# Patient Record
Sex: Male | Born: 1965 | Race: White | Hispanic: No | Marital: Married | Smoking: Former smoker
Health system: Southern US, Community
[De-identification: ages and names within clinical notes are randomized; demographics above are authoritative.]

## PROBLEM LIST (undated history)

## (undated) DIAGNOSIS — K219 Gastro-esophageal reflux disease without esophagitis: Secondary | ICD-10-CM

## (undated) DIAGNOSIS — F419 Anxiety disorder, unspecified: Secondary | ICD-10-CM

## (undated) DIAGNOSIS — I10 Essential (primary) hypertension: Secondary | ICD-10-CM

## (undated) DIAGNOSIS — E785 Hyperlipidemia, unspecified: Secondary | ICD-10-CM

## (undated) DIAGNOSIS — N419 Inflammatory disease of prostate, unspecified: Secondary | ICD-10-CM

## (undated) HISTORY — PX: NASAL SINUS SURGERY: SHX719

---

## 2012-12-03 ENCOUNTER — Emergency Department (HOSPITAL_BASED_OUTPATIENT_CLINIC_OR_DEPARTMENT_OTHER)
Admission: EM | Admit: 2012-12-03 | Discharge: 2012-12-03 | Disposition: A | Discharge status: Home / Self Care | Payer: BC Managed Care – PPO | Attending: Emergency Medicine | Admitting: Emergency Medicine

## 2012-12-03 ENCOUNTER — Emergency Department (HOSPITAL_BASED_OUTPATIENT_CLINIC_OR_DEPARTMENT_OTHER): Payer: BC Managed Care – PPO

## 2012-12-03 ENCOUNTER — Encounter (HOSPITAL_BASED_OUTPATIENT_CLINIC_OR_DEPARTMENT_OTHER): Payer: Self-pay | Admitting: Emergency Medicine

## 2012-12-03 DIAGNOSIS — Z87448 Personal history of other diseases of urinary system: Secondary | ICD-10-CM | POA: Insufficient documentation

## 2012-12-03 DIAGNOSIS — R509 Fever, unspecified: Secondary | ICD-10-CM | POA: Insufficient documentation

## 2012-12-03 DIAGNOSIS — N451 Epididymitis: Secondary | ICD-10-CM

## 2012-12-03 DIAGNOSIS — I10 Essential (primary) hypertension: Secondary | ICD-10-CM | POA: Insufficient documentation

## 2012-12-03 DIAGNOSIS — F411 Generalized anxiety disorder: Secondary | ICD-10-CM | POA: Insufficient documentation

## 2012-12-03 DIAGNOSIS — E785 Hyperlipidemia, unspecified: Secondary | ICD-10-CM | POA: Insufficient documentation

## 2012-12-03 DIAGNOSIS — N453 Epididymo-orchitis: Secondary | ICD-10-CM | POA: Insufficient documentation

## 2012-12-03 DIAGNOSIS — Z880 Allergy status to penicillin: Secondary | ICD-10-CM | POA: Insufficient documentation

## 2012-12-03 DIAGNOSIS — R110 Nausea: Secondary | ICD-10-CM | POA: Insufficient documentation

## 2012-12-03 DIAGNOSIS — Z87891 Personal history of nicotine dependence: Secondary | ICD-10-CM | POA: Insufficient documentation

## 2012-12-03 DIAGNOSIS — Z79899 Other long term (current) drug therapy: Secondary | ICD-10-CM | POA: Insufficient documentation

## 2012-12-03 HISTORY — DX: Essential (primary) hypertension: I10

## 2012-12-03 HISTORY — DX: Hyperlipidemia, unspecified: E78.5

## 2012-12-03 HISTORY — DX: Inflammatory disease of prostate, unspecified: N41.9

## 2012-12-03 HISTORY — DX: Anxiety disorder, unspecified: F41.9

## 2012-12-03 LAB — URINALYSIS, ROUTINE W REFLEX MICROSCOPIC
Glucose, UA: NEGATIVE mg/dL
Ketones, ur: NEGATIVE mg/dL
Nitrite: NEGATIVE
Protein, ur: NEGATIVE mg/dL
pH: 6 (ref 5.0–8.0)

## 2012-12-03 LAB — URINE MICROSCOPIC-ADD ON

## 2012-12-03 MED ORDER — LEVOFLOXACIN 500 MG PO TABS
500.0000 mg | ORAL_TABLET | Freq: Once | ORAL | Status: AC
  Administered 2012-12-03: 500 mg via ORAL
  Filled 2012-12-03: qty 1

## 2012-12-03 MED ORDER — HYDROMORPHONE HCL PF 1 MG/ML IJ SOLN
1.0000 mg | Freq: Once | INTRAMUSCULAR | Status: AC
  Administered 2012-12-03: 1 mg via INTRAMUSCULAR
  Filled 2012-12-03: qty 1

## 2012-12-03 MED ORDER — LEVOFLOXACIN 500 MG PO TABS: 500.0000 mg | ORAL_TABLET | Freq: Every day | ORAL | Status: AC

## 2012-12-03 MED ORDER — HYDROCODONE-ACETAMINOPHEN 5-325 MG PO TABS: 1.0000 | ORAL_TABLET | Freq: Three times a day (TID) | ORAL | Status: AC | PRN

## 2012-12-03 NOTE — ED Provider Notes (Signed)
CSN: [id number]     Arrival date & time 12/03/12  1159 History   First MD Initiated Contact with Patient 12/03/12 1214     Chief Complaint  Patient presents with  . Testicle Pain   HPI  Patient presents with scrotal pain, right inguinal crease pain, dysuria.  This episode began in the past 2 days and became worse over the past day.  The pain is focally about the distal right inguinal canal, right scrotum.  He notes dysuria, without polyuria, but with increased need for pressure to urinate. Presents with nausea, diaphoresis, but no fever. Patient is one prior similar episode in the past, diagnosed as prostatitis. He is generally well, take medication only for hypertension. He saw a family practice physician yesterday, was started on Flomax.  Urine culture pending   Past Medical History  Diagnosis Date  . Hypertension   . Anxiety   . Hyperlipidemia   . Prostatitis    Past Surgical History  Procedure Laterality Date  . Nasal sinus surgery     No family history on file. History  Substance Use Topics  . Smoking status: Former Smoker    Quit date: 09/14/2011  . Smokeless tobacco: Not on file  . Alcohol Use: Yes     Comment: Ocassionally    Review of Systems  Constitutional: Positive for fever and chills.  Respiratory: Negative.   Cardiovascular: Negative.   Gastrointestinal: Positive for nausea.  Endocrine: Negative for polyuria.  Genitourinary:       HPI  Musculoskeletal: Negative.   Skin: Negative for rash.  Allergic/Immunologic: Negative for immunocompromised state.  Neurological: Negative for weakness.    Allergies  Codeine; Diclofenac; Doxycycline; Penicillins; Sulfa antibiotics; and Tetracyclines & related  Home Medications   Current Outpatient Rx  Name  Route  Sig  Dispense  Refill  . LISINOPRIL-HYDROCHLOROTHIAZIDE PO   Oral   Take by mouth.         Marland Kitchen omeprazole (PRILOSEC) 40 MG capsule   Oral   Take 40 mg by mouth daily.         Marland Kitchen PARoxetine  (PAXIL) 20 MG tablet   Oral   Take 20 mg by mouth every morning.         . simvastatin (ZOCOR) 20 MG tablet   Oral   Take 20 mg by mouth every evening.         Marland Kitchen TAMSULOSIN HCL PO   Oral   Take by mouth.          BP 156/88  Pulse 99  Temp(Src) 98.4 F (36.9 C) (Oral)  Resp 18  Ht 5\' 11"  (1.803 m)  Wt 272 lb (123.378 kg)  BMI 37.95 kg/m2  SpO2 100% Physical Exam  Nursing note and vitals reviewed. Constitutional: He is oriented to person, place, and time. He appears well-developed. No distress.  HENT:  Head: Normocephalic and atraumatic.  Eyes: Conjunctivae and EOM are normal.  Cardiovascular: Normal rate and regular rhythm.   Pulmonary/Chest: Effort normal. No stridor. No respiratory distress.  Abdominal: Soft. Normal appearance. He exhibits no distension. Hernia confirmed negative in the right inguinal area and confirmed negative in the left inguinal area.    Genitourinary: Penis normal. Right testis shows tenderness. Right testis shows no swelling. Right testis is descended. Left testis shows tenderness. Left testis shows no swelling. Left testis is descended. Circumcised.  Musculoskeletal: He exhibits no edema.  Lymphadenopathy:       Right: No inguinal adenopathy present.  Left: No inguinal adenopathy present.  Neurological: He is alert and oriented to person, place, and time.  Skin: Skin is warm and dry.  Psychiatric: He has a normal mood and affect.    ED Course  Procedures (including critical care time) Labs Review Labs Reviewed  URINALYSIS, ROUTINE W REFLEX MICROSCOPIC - Abnormal; Notable for the following:    APPearance CLOUDY (*)    Hgb urine dipstick MODERATE (*)    Leukocytes, UA LARGE (*)    All other components within normal limits  URINE MICROSCOPIC-ADD ON   Imaging Review No results found. 2:16 PM Patient and family aware of all results.  He appears calm on repeat exam. MDM  No diagnosis found. Patient presents with dysuria,  scrotal pain.  On exam patient is afebrile, very uncomfortable with minimal palpation of his inguinal crease and scrotum.  Patient's evaluation demonstrates likely of epididymitis.  Absent fever, distress he was discharged in stable condition to followup with urology.   Gerhard Munch, MD 12/03/12 [phone]

## 2012-12-03 NOTE — ED Notes (Signed)
Patient c/o  Right testicular pain at 0600 this morning after urinating on waking.  Seen at [facility] yesterday for dysuria x one week intermittently.  Given Flomax yesterday and took a dose this morning.  Had a course of Levaquin in June for similar symptoms, prostatitis.

## 2012-12-05 ENCOUNTER — Emergency Department (HOSPITAL_BASED_OUTPATIENT_CLINIC_OR_DEPARTMENT_OTHER)
Admission: EM | Admit: 2012-12-05 | Discharge: 2012-12-05 | Disposition: A | Discharge status: Home / Self Care | Payer: BC Managed Care – PPO | Attending: Emergency Medicine | Admitting: Emergency Medicine

## 2012-12-05 ENCOUNTER — Encounter (HOSPITAL_BASED_OUTPATIENT_CLINIC_OR_DEPARTMENT_OTHER): Payer: Self-pay | Admitting: Emergency Medicine

## 2012-12-05 DIAGNOSIS — E785 Hyperlipidemia, unspecified: Secondary | ICD-10-CM | POA: Insufficient documentation

## 2012-12-05 DIAGNOSIS — N451 Epididymitis: Secondary | ICD-10-CM

## 2012-12-05 DIAGNOSIS — I10 Essential (primary) hypertension: Secondary | ICD-10-CM | POA: Insufficient documentation

## 2012-12-05 DIAGNOSIS — Z87448 Personal history of other diseases of urinary system: Secondary | ICD-10-CM | POA: Insufficient documentation

## 2012-12-05 DIAGNOSIS — Z792 Long term (current) use of antibiotics: Secondary | ICD-10-CM | POA: Insufficient documentation

## 2012-12-05 DIAGNOSIS — Z79899 Other long term (current) drug therapy: Secondary | ICD-10-CM | POA: Insufficient documentation

## 2012-12-05 DIAGNOSIS — R112 Nausea with vomiting, unspecified: Secondary | ICD-10-CM | POA: Insufficient documentation

## 2012-12-05 DIAGNOSIS — F411 Generalized anxiety disorder: Secondary | ICD-10-CM | POA: Insufficient documentation

## 2012-12-05 DIAGNOSIS — Z87891 Personal history of nicotine dependence: Secondary | ICD-10-CM | POA: Insufficient documentation

## 2012-12-05 DIAGNOSIS — N453 Epididymo-orchitis: Secondary | ICD-10-CM | POA: Insufficient documentation

## 2012-12-05 LAB — CBC WITH DIFFERENTIAL/PLATELET
Eosinophils Absolute: 0.2 10*3/uL (ref 0.0–0.7)
Eosinophils Relative: 3 % (ref 0–5)
HCT: 41.2 % (ref 39.0–52.0)
Lymphocytes Relative: 41 % (ref 12–46)
Lymphs Abs: 2.4 10*3/uL (ref 0.7–4.0)
MCH: 31.8 pg (ref 26.0–34.0)
MCV: 92.8 fL (ref 78.0–100.0)
Monocytes Absolute: 0.8 10*3/uL (ref 0.1–1.0)
Monocytes Relative: 14 % — ABNORMAL HIGH (ref 3–12)
RBC: 4.44 MIL/uL (ref 4.22–5.81)
WBC: 5.8 10*3/uL (ref 4.0–10.5)

## 2012-12-05 LAB — BASIC METABOLIC PANEL
BUN: 11 mg/dL (ref 6–23)
CO2: 30 mEq/L (ref 19–32)
Calcium: 9.9 mg/dL (ref 8.4–10.5)
Creatinine, Ser: 0.9 mg/dL (ref 0.50–1.35)
GFR calc non Af Amer: >90 mL/min (ref >90)
Glucose, Bld: 110 mg/dL — ABNORMAL HIGH (ref 70–99)

## 2012-12-05 LAB — URINALYSIS, ROUTINE W REFLEX MICROSCOPIC
Bilirubin Urine: NEGATIVE
Glucose, UA: NEGATIVE mg/dL
Hgb urine dipstick: NEGATIVE
Ketones, ur: NEGATIVE mg/dL
Protein, ur: NEGATIVE mg/dL

## 2012-12-05 LAB — URINE MICROSCOPIC-ADD ON

## 2012-12-05 MED ORDER — HYDROMORPHONE HCL 2 MG PO TABS: 2.0000 mg | ORAL_TABLET | ORAL | Status: AC | PRN

## 2012-12-05 MED ORDER — SODIUM CHLORIDE 0.9 % IV BOLUS (SEPSIS)
1000.0000 mL | Freq: Once | INTRAVENOUS | Status: AC
  Administered 2012-12-05: 1000 mL via INTRAVENOUS

## 2012-12-05 MED ORDER — ONDANSETRON HCL 4 MG/2ML IJ SOLN
4.0000 mg | Freq: Once | INTRAMUSCULAR | Status: AC
  Administered 2012-12-05: 4 mg via INTRAVENOUS
  Filled 2012-12-05: qty 2

## 2012-12-05 MED ORDER — ONDANSETRON HCL 4 MG PO TABS: 4.0000 mg | ORAL_TABLET | Freq: Four times a day (QID) | ORAL | Status: AC

## 2012-12-05 MED ORDER — HYDROMORPHONE HCL PF 1 MG/ML IJ SOLN
1.0000 mg | Freq: Once | INTRAMUSCULAR | Status: AC
  Administered 2012-12-05: 1 mg via INTRAVENOUS
  Filled 2012-12-05: qty 1

## 2012-12-05 NOTE — ED Provider Notes (Signed)
This chart was scribed for Layla Maw Ward, DO by Leone Payor, ED Scribe. This patient was seen in room MH09/MH09 and the patient's care was started 4:44 PM.  TIME SEEN: 4:44 PM   CHIEF COMPLAINT: Testicle Pain  HPI: HPI Comments: Joshua Ayala is a 47 y.o. male who presents to the Emergency Department complaining of constant, worsened right testicular pain and swelling that began 2 days ago. Pt was seen on 12/03/12 and had an ultrasound which revealed epididymitis. He had normal blood flow to bilateral testicles. He was discharged home with prescription for Vicodin, Levaquin and Flomax. He reports that his pain is worse. He reports having associated nausea and emesis. Pt states he is sexually active but denies history of STDs. He denies any recent trauma or injury to the area. He denies fever, dysuria, hematuria. No penile discharge.  ROS: See HPI Constitutional: no fever  Eyes: no drainage  ENT: no runny nose   Cardiovascular:  no chest pain  Resp: no SOB  GI: has nausea and vomiting GU: no dysuria, hematuria Integumentary: no rash  Allergy: no hives  Musculoskeletal: no leg swelling  Neurological: no slurred speech ROS otherwise negative  PAST MEDICAL HISTORY/PAST SURGICAL HISTORY:  Past Medical History  Diagnosis Date  . Hypertension   . Anxiety   . Hyperlipidemia   . Prostatitis     MEDICATIONS:  Prior to Admission medications   Medication Sig Start Date End Date Taking? Authorizing Provider  HYDROcodone-acetaminophen (NORCO/VICODIN) 5-325 MG per tablet Take 1-2 tablets by mouth every 8 (eight) hours as needed for pain. 12/03/12   Gerhard Munch, MD  levofloxacin (LEVAQUIN) 500 MG tablet Take 1 tablet (500 mg total) by mouth daily. 12/04/12 12/12/12  Gerhard Munch, MD  LISINOPRIL-HYDROCHLOROTHIAZIDE PO Take by mouth.    Historical Provider, MD  omeprazole (PRILOSEC) 40 MG capsule Take 40 mg by mouth daily.    Historical Provider, MD  PARoxetine (PAXIL) 20 MG tablet Take 20 mg  by mouth every morning.    Historical Provider, MD  simvastatin (ZOCOR) 20 MG tablet Take 20 mg by mouth every evening.    Historical Provider, MD  TAMSULOSIN HCL PO Take by mouth.    Historical Provider, MD    ALLERGIES:  Allergies  Allergen Reactions  . Codeine Rash  . Diclofenac Rash  . Doxycycline Rash  . Penicillins Rash  . Sulfa Antibiotics Rash  . Tetracyclines & Related Rash    SOCIAL HISTORY:  History  Substance Use Topics  . Smoking status: Former Smoker    Quit date: 09/14/2011  . Smokeless tobacco: Not on file  . Alcohol Use: Yes     Comment: Ocassionally    FAMILY HISTORY: History reviewed. No pertinent family history.  EXAM: BP 106/72  Pulse 98  Temp(Src) 97.5 F (36.4 C)  Resp 16  SpO2 99% CONSTITUTIONAL: Alert and oriented and responds appropriately to questions. Well-appearing; well-nourished, no apparent distress HEAD: Normocephalic EYES: Conjunctivae clear, PERRL ENT: normal nose; no rhinorrhea; moist mucous membranes; pharynx without lesions noted NECK: Supple, no meningismus, no LAD  CARD: RRR; S1 and S2 appreciated; no murmurs, no clicks, no rubs, no gallops RESP: Normal chest excursion without splinting or tachypnea; breath sounds clear and equal bilaterally; no wheezes, no rhonchi, no rales,  ABD/GI: Normal bowel sounds; non-distended; soft, non-tender, no rebound, no guarding, obese GU: no external genital lesions. No penile discharge. Left testicle non tender and non swollen. Right testicle non tender except over the epididymis which is swollen. There  is no high riding testicle. No other scrotal masses. Normal cremasteric reflex bilaterally.  BACK:  The back appears normal and is non-tender to palpation, there is no CVA tenderness EXT: Normal ROM in all joints; non-tender to palpation; no edema; normal capillary refill; no cyanosis    SKIN: Normal color for age and race; warm NEURO: Moves all extremities equally PSYCH: The patient's mood and  manner are appropriate. Grooming and personal hygiene are appropriate.  MEDICAL DECISION MAKING:   Will repeat labs and urine. Will control pain. Will give antiemetic and IV fluids. Unlikely testicular torsion given patient has cremasteric reflex and no high riding testicle. Had recent US diagnosed with epididymitis. Recent ultrasound showed normal blood flow to bilateral testicles. Pt refuses transfer to [facility] for repeat US today.    ED PROGRESS:   4:45 PM Discussed treatment plan with pt at bedside and pt agreed to plan. Will receive IV nausea and pain medication along with lab work, urinalysis.  8:38 PM  Pt pain is controlled. No further vomiting. His labs and urine are unremarkable. We'll discharge home with Zofran and pain medication, have him continue his Levaquin, urology followup. Given customary usual return precautions. Patient verbalizes understanding is comfortable this plan  I personally performed the services described in this documentation, which was scribed in my presence. The recorded information has been reviewed and is accurate.   Layla Maw Ward, DO 12/05/12 2349

## 2012-12-05 NOTE — ED Notes (Signed)
Seen Friday, given antibiotics for epidimytis, states pain is not better and continues to be swollen, pt is nauseated and vomiting also

## 2013-09-10 ENCOUNTER — Emergency Department (HOSPITAL_BASED_OUTPATIENT_CLINIC_OR_DEPARTMENT_OTHER)
Admission: EM | Admit: 2013-09-10 | Discharge: 2013-09-10 | Disposition: A | Discharge status: Home / Self Care | Payer: BC Managed Care – PPO | Attending: Emergency Medicine | Admitting: Emergency Medicine

## 2013-09-10 ENCOUNTER — Encounter (HOSPITAL_BASED_OUTPATIENT_CLINIC_OR_DEPARTMENT_OTHER): Payer: Self-pay | Admitting: Emergency Medicine

## 2013-09-10 ENCOUNTER — Emergency Department (HOSPITAL_BASED_OUTPATIENT_CLINIC_OR_DEPARTMENT_OTHER): Payer: BC Managed Care – PPO

## 2013-09-10 DIAGNOSIS — N201 Calculus of ureter: Secondary | ICD-10-CM

## 2013-09-10 DIAGNOSIS — F411 Generalized anxiety disorder: Secondary | ICD-10-CM | POA: Insufficient documentation

## 2013-09-10 DIAGNOSIS — Z87891 Personal history of nicotine dependence: Secondary | ICD-10-CM | POA: Insufficient documentation

## 2013-09-10 DIAGNOSIS — G8929 Other chronic pain: Secondary | ICD-10-CM | POA: Insufficient documentation

## 2013-09-10 DIAGNOSIS — Z880 Allergy status to penicillin: Secondary | ICD-10-CM | POA: Insufficient documentation

## 2013-09-10 DIAGNOSIS — Z79899 Other long term (current) drug therapy: Secondary | ICD-10-CM | POA: Insufficient documentation

## 2013-09-10 DIAGNOSIS — M545 Low back pain, unspecified: Secondary | ICD-10-CM | POA: Insufficient documentation

## 2013-09-10 DIAGNOSIS — E785 Hyperlipidemia, unspecified: Secondary | ICD-10-CM | POA: Insufficient documentation

## 2013-09-10 DIAGNOSIS — I10 Essential (primary) hypertension: Secondary | ICD-10-CM | POA: Insufficient documentation

## 2013-09-10 LAB — URINALYSIS, ROUTINE W REFLEX MICROSCOPIC
BILIRUBIN URINE: NEGATIVE
Glucose, UA: NEGATIVE mg/dL
KETONES UR: NEGATIVE mg/dL
NITRITE: NEGATIVE
PH: 5 (ref 5.0–8.0)
Protein, ur: NEGATIVE mg/dL
Specific Gravity, Urine: 1.02 (ref 1.005–1.030)
Urobilinogen, UA: 0.2 mg/dL (ref 0.0–1.0)

## 2013-09-10 LAB — CBC WITH DIFFERENTIAL/PLATELET
Basophils Absolute: 0.1 10*3/uL (ref 0.0–0.1)
Basophils Relative: 1 % (ref 0–1)
EOS ABS: 0.4 10*3/uL (ref 0.0–0.7)
EOS PCT: 4 % (ref 0–5)
HCT: 46.4 % (ref 39.0–52.0)
HEMOGLOBIN: 16.6 g/dL (ref 13.0–17.0)
Lymphocytes Relative: 40 % (ref 12–46)
Lymphs Abs: 3.5 10*3/uL (ref 0.7–4.0)
MCH: 32.5 pg (ref 26.0–34.0)
MCHC: 35.8 g/dL (ref 30.0–36.0)
MCV: 90.8 fL (ref 78.0–100.0)
MONOS PCT: 8 % (ref 3–12)
Monocytes Absolute: 0.6 10*3/uL (ref 0.1–1.0)
NEUTROS PCT: 47 % (ref 43–77)
Neutro Abs: 4.1 10*3/uL (ref 1.7–7.7)
Platelets: 240 10*3/uL (ref 150–400)
RBC: 5.11 MIL/uL (ref 4.22–5.81)
RDW: 13.1 % (ref 11.5–15.5)
WBC: 8.6 10*3/uL (ref 4.0–10.5)

## 2013-09-10 LAB — BASIC METABOLIC PANEL
BUN: 18 mg/dL (ref 6–23)
CO2: 25 mEq/L (ref 19–32)
Calcium: 9.7 mg/dL (ref 8.4–10.5)
Chloride: 101 mEq/L (ref 96–112)
Creatinine, Ser: 1 mg/dL (ref 0.50–1.35)
GFR calc Af Amer: >90 mL/min (ref >90)
GFR, EST NON AFRICAN AMERICAN: 87 mL/min — AB (ref >90)
Glucose, Bld: 131 mg/dL — ABNORMAL HIGH (ref 70–99)
POTASSIUM: 3.7 meq/L (ref 3.7–5.3)
Sodium: 139 mEq/L (ref 137–147)

## 2013-09-10 LAB — URINE MICROSCOPIC-ADD ON

## 2013-09-10 MED ORDER — NAPROXEN 250 MG PO TABS
500.0000 mg | ORAL_TABLET | Freq: Once | ORAL | Status: AC
  Administered 2013-09-10: 500 mg via ORAL
  Filled 2013-09-10: qty 2

## 2013-09-10 MED ORDER — FENTANYL CITRATE 0.05 MG/ML IJ SOLN
100.0000 ug | Freq: Once | INTRAMUSCULAR | Status: AC
  Administered 2013-09-10: 100 ug via INTRAVENOUS
  Filled 2013-09-10: qty 2

## 2013-09-10 MED ORDER — TAMSULOSIN HCL 0.4 MG PO CAPS
0.4000 mg | ORAL_CAPSULE | Freq: Once | ORAL | Status: AC
Start: 2013-09-10 — End: 2013-09-10
  Administered 2013-09-10: 0.4 mg via ORAL
  Filled 2013-09-10: qty 1

## 2013-09-10 MED ORDER — SODIUM CHLORIDE 0.9 % IV SOLN
INTRAVENOUS | Status: DC
  Administered 2013-09-10: 07:00:00 via INTRAVENOUS

## 2013-09-10 MED ORDER — ONDANSETRON HCL 4 MG/2ML IJ SOLN
4.0000 mg | Freq: Once | INTRAMUSCULAR | Status: AC
  Administered 2013-09-10: 4 mg via INTRAVENOUS
  Filled 2013-09-10: qty 2

## 2013-09-10 MED ORDER — ONDANSETRON 8 MG PO TBDP: 8.0000 mg | ORAL_TABLET | Freq: Three times a day (TID) | ORAL | Status: AC | PRN

## 2013-09-10 MED ORDER — MORPHINE SULFATE 4 MG/ML IJ SOLN
4.0000 mg | Freq: Once | INTRAMUSCULAR | Status: AC
  Administered 2013-09-10: 4 mg via INTRAVENOUS
  Filled 2013-09-10: qty 1

## 2013-09-10 MED ORDER — FENTANYL CITRATE 0.05 MG/ML IJ SOLN
100.0000 ug | Freq: Once | INTRAMUSCULAR | Status: AC
Start: 2013-09-10 — End: 2013-09-10
  Administered 2013-09-10: 100 ug via INTRAVENOUS
  Filled 2013-09-10: qty 2

## 2013-09-10 MED ORDER — KETOROLAC TROMETHAMINE 15 MG/ML IJ SOLN
15.0000 mg | Freq: Once | INTRAMUSCULAR | Status: DC
  Filled 2013-09-10: qty 1

## 2013-09-10 MED ORDER — ONDANSETRON HCL 4 MG/2ML IJ SOLN
4.0000 mg | Freq: Once | INTRAMUSCULAR | Status: AC
Start: 2013-09-10 — End: 2013-09-10
  Administered 2013-09-10: 4 mg via INTRAVENOUS

## 2013-09-10 MED ORDER — NAPROXEN SODIUM 220 MG PO TABS: ORAL_TABLET | ORAL | Status: AC

## 2013-09-10 MED ORDER — SODIUM CHLORIDE 0.9 % IV BOLUS (SEPSIS)
1000.0000 mL | Freq: Once | INTRAVENOUS | Status: AC
  Administered 2013-09-10: 1000 mL via INTRAVENOUS

## 2013-09-10 MED ORDER — TAMSULOSIN HCL 0.4 MG PO CAPS: ORAL_CAPSULE | ORAL | Status: AC

## 2013-09-10 MED ORDER — ONDANSETRON HCL 4 MG/2ML IJ SOLN
INTRAMUSCULAR | Status: AC
  Filled 2013-09-10: qty 2

## 2013-09-10 MED ORDER — OXYCODONE-ACETAMINOPHEN 10-325 MG PO TABS: 0.5000 | ORAL_TABLET | ORAL | Status: AC | PRN

## 2013-09-10 MED ORDER — OXYCODONE-ACETAMINOPHEN 5-325 MG PO TABS
2.0000 | ORAL_TABLET | Freq: Once | ORAL | Status: AC
  Administered 2013-09-10: 2 via ORAL
  Filled 2013-09-10: qty 2

## 2013-09-10 NOTE — ED Notes (Signed)
Right flank pain that radiates to the front, pain in testicles as well

## 2013-09-10 NOTE — ED Provider Notes (Signed)
9:32 AM Have had difficulty controlling pain w/ IV fentanyl and trial of PO percocet. Will do tril of IV morphine. Cr. 1, WBC nml. Ordered CT due to uncontrolled pain which shows 3x3.34mm prox R ureteral stone w/ obstruction.   10:57 PM Pt feeling somewhat better, tolerated liquids, would like to try continued care at home. Pt d/c'd w/ Dr. Read Drivers' instructions w/ percocet, zofran, flomax and outpt urology f/u. Return precautions given for new or worsening symptoms including worsening or uncontrolled pain, fever, inability to tolerate PO.   1. Right ureteral stone       Shanna Cisco, MD 09/10/13 1058

## 2013-09-10 NOTE — ED Provider Notes (Signed)
CSN: [id number]     Arrival date & time 09/10/13  0630 History   First MD Initiated Contact with Patient 09/10/13 [phone]     Chief Complaint  Patient presents with  . Flank Pain     (Consider location/radiation/quality/duration/timing/severity/associated sxs/prior Treatment) HPI This is a 48 year old male with a history of chronic low back pain on the right side. He also has a history of epididymitis last year which involved a CT scan. The CT scan showed a stone in the right kidney but without ureterolithiasis. He awoke this morning at 345 with severe pain in his right flank radiating to his right lower quadrant. He states it feels like his right testicle is being squeezed but his testicle is not enlarged or swollen as when he had epididymitis. There is some change in pain with movement. He denies nausea or vomiting. He has noticed dark urine. He has a chronic facial tic, left greater than right.  Past Medical History  Diagnosis Date  . Hypertension   . Anxiety   . Hyperlipidemia   . Prostatitis    Past Surgical History  Procedure Laterality Date  . Nasal sinus surgery     History reviewed. No pertinent family history. History  Substance Use Topics  . Smoking status: Former Smoker    Quit date: 09/14/2011  . Smokeless tobacco: Not on file  . Alcohol Use: Yes     Comment: Ocassionally    Review of Systems  All other systems reviewed and are negative.    Codeine; Diclofenac; Doxycycline; Penicillins; Sulfa antibiotics; and Tetracyclines & related  Home Medications   Prior to Admission medications   Medication Sig Start Date End Date Taking? Authorizing Provider  LISINOPRIL-HYDROCHLOROTHIAZIDE PO Take by mouth.   Yes Historical Provider, MD  omeprazole (PRILOSEC) 40 MG capsule Take 40 mg by mouth daily.   Yes Historical Provider, MD  PARoxetine (PAXIL) 20 MG tablet Take 20 mg by mouth every morning.   Yes Historical Provider, MD  simvastatin (ZOCOR) 20 MG tablet Take 20 mg by  mouth every evening.   Yes Historical Provider, MD  HYDROcodone-acetaminophen (NORCO/VICODIN) 5-325 MG per tablet Take 1-2 tablets by mouth every 8 (eight) hours as needed for pain. 12/03/12   Gerhard Munch, MD  HYDROmorphone (DILAUDID) 2 MG tablet Take 1 tablet (2 mg total) by mouth every 4 (four) hours as needed for pain. 12/05/12   Kristen N Ward, DO  ondansetron (ZOFRAN) 4 MG tablet Take 1 tablet (4 mg total) by mouth every 6 (six) hours. 12/05/12   Kristen N Ward, DO  TAMSULOSIN HCL PO Take by mouth.    Historical Provider, MD   BP 149/101  Pulse 75  Temp(Src) 97.4 F (36.3 C) (Oral)  Resp 20  Ht 5' 11.5" (1.816 m)  Wt 260 lb (117.935 kg)  BMI 35.76 kg/m2  SpO2 100%  Physical Exam General: Well-developed, well-nourished male in no acute distress; appearance consistent with age of record; appears uncomfortable HENT: normocephalic; atraumatic Eyes: pupils equal, round and reactive to light; extraocular muscles intact Neck: supple Heart: regular rate and rhythm Lungs: clear to auscultation bilaterally Abdomen: soft; nondistended; nontender; no masses or hepatosplenomegaly; bowel sounds present GU: Mild right CVA tenderness; urine grossly bloody Extremities: No deformity; full range of motion; pulses normal Neurologic: Awake, alert and oriented; motor function intact in all extremities and symmetric; no facial droop; periorbital twitching, left greater than right Skin: Warm and dry Psychiatric: Normal mood and affect    ED Course  Procedures (  including critical care time)   MDM   Nursing notes and vitals signs, including pulse oximetry, reviewed.  Summary of this visit's results, reviewed by myself:  Labs:  Results for orders placed during the hospital encounter of 09/10/13 (from the past 24 hour(s))  URINALYSIS, ROUTINE W REFLEX MICROSCOPIC     Status: Abnormal   Collection Time    09/10/13  6:35 AM      Result Value Ref Range   Color, Urine AMBER (*) YELLOW    APPearance CLOUDY (*) CLEAR   Specific Gravity, Urine 1.020  1.005 - 1.030   pH 5.0  5.0 - 8.0   Glucose, UA NEGATIVE  NEGATIVE mg/dL   Hgb urine dipstick LARGE (*) NEGATIVE   Bilirubin Urine NEGATIVE  NEGATIVE   Ketones, ur NEGATIVE  NEGATIVE mg/dL   Protein, ur NEGATIVE  NEGATIVE mg/dL   Urobilinogen, UA 0.2  0.0 - 1.0 mg/dL   Nitrite NEGATIVE  NEGATIVE   Leukocytes, UA TRACE (*) NEGATIVE  URINE MICROSCOPIC-ADD ON     Status: None   Collection Time    09/10/13  6:35 AM      Result Value Ref Range   Squamous Epithelial / LPF RARE  RARE   WBC, UA 0-2  <3 WBC/hpf   RBC / HPF TOO NUMEROUS TO COUNT  <3 RBC/hpf   Bacteria, UA RARE  RARE   Urine-Other MUCOUS PRESENT     7:02 AM As the patient has known right nephrolithiasis and urinalysis showing gross hematuria as well as microscopic hematuria this patient can be given a clinical diagnosis of right ureterolithiasis. CT scan is not indicated at this time.     Hanley SeamenJohn L Remingtyn Depaola, MD 09/10/13 [phone]

## 2013-09-10 NOTE — ED Notes (Signed)
Patient gave urine sample, I took to lab.

## 2013-09-10 NOTE — Discharge Instructions (Signed)
Ureteral Colic (Kidney Stones) °Ureteral colic is the result of a condition when kidney stones form inside the kidney. Once kidney stones are formed they may move into the tube that connects the kidney with the bladder (ureter). If this occurs, this condition may cause pain (colic) in the ureter.  °CAUSES  °Pain is caused by stone movement in the ureter and the obstruction caused by the stone. °SYMPTOMS  °The pain comes and goes as the ureter contracts around the stone. The pain is usually intense, sharp, and stabbing in character. The location of the pain may move as the stone moves through the ureter. When the stone is near the kidney the pain is usually located in the back and radiates to the belly (abdomen). When the stone is ready to pass into the bladder the pain is often located in the lower abdomen on the side the stone is located. At this location, the symptoms may mimic those of a urinary tract infection with urinary frequency. Once the stone is located here it often passes into the bladder and the pain disappears completely. °TREATMENT  °· Your caregiver will provide you with medicine for pain relief. °· You may require specialized follow-up X-rays. °· The absence of pain does not always mean that the stone has passed. It may have just stopped moving. If the urine remains completely obstructed, it can cause loss of kidney function or even complete destruction of the involved kidney. It is your responsibility and in your interest that X-rays and follow-ups as suggested by your caregiver are completed. Relief of pain without passage of the stone can be associated with severe damage to the kidney, including loss of kidney function on that side. °· If your stone does not pass on its own, additional measures may be taken by your caregiver to ensure its removal. °HOME CARE INSTRUCTIONS  °· Increase your fluid intake. Water is the preferred fluid since juices containing vitamin C may acidify the urine making it  less likely for certain stones (uric acid stones) to pass. °· Strain all urine. A strainer will be provided. Keep all particulate matter or stones for your caregiver to inspect. °· Take your pain medicine as directed. °· Make a follow-up appointment with your caregiver as directed. °· Remember that the goal is passage of your stone. The absence of pain does not mean the stone is gone. Follow your caregiver's instructions. °· Only take over-the-counter or prescription medicines for pain, discomfort, or fever as directed by your caregiver. °SEEK MEDICAL CARE IF:  °· Pain cannot be controlled with the prescribed medicine. °· You have a fever. °· Pain continues for longer than your caregiver advises it should. °· There is a change in the pain, and you develop chest discomfort or constant abdominal pain. °· You feel faint or pass out. °MAKE SURE YOU:  °· Understand these instructions. °· Will watch your condition. °· Will get help right away if you are not doing well or get worse. °Document Released: 01/01/2005 Document Revised: 07/19/2012 Document Reviewed: 09/18/2010 °[employer]® Patient Information ©2014 [employer]. ° °

## 2015-11-15 IMAGING — CT CT ABD-PELV W/O CM
2 of 4 series · 16 of 46 positions shown, 18 images · non-contrast
Comparison: Prior lumbar spine radiographs 06/29/2013; prior CT
abdomen/ pelvis 01/26/2013

CLINICAL DATA: Posterior right flank pain

EXAM:
CT ABDOMEN AND PELVIS WITHOUT CONTRAST
TECHNIQUE: Multidetector CT imaging of the abdomen and pelvis was performed
following the standard protocol without IV contrast.

[Series 2: renal stone > 200 lbs 5.0 b31f · axial · 0.87mm/px · z∈[-538,-88]mm · 13 of 100 slices shown, 15 images]
[im 5/100  soft-tissue]
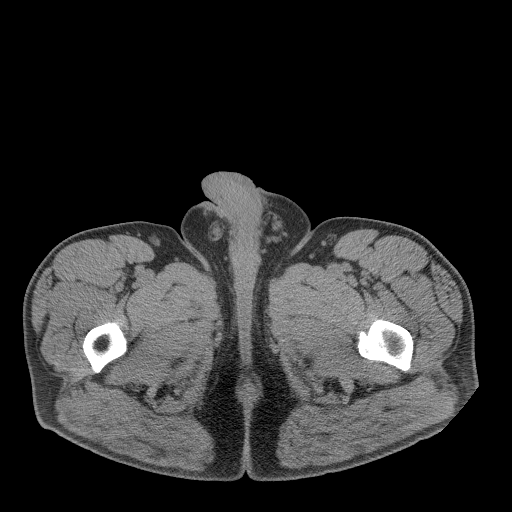
[im 5/100  bone]
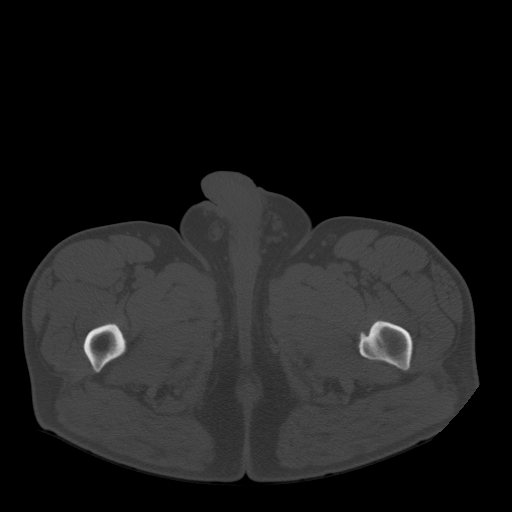
[im 13/100  soft-tissue]
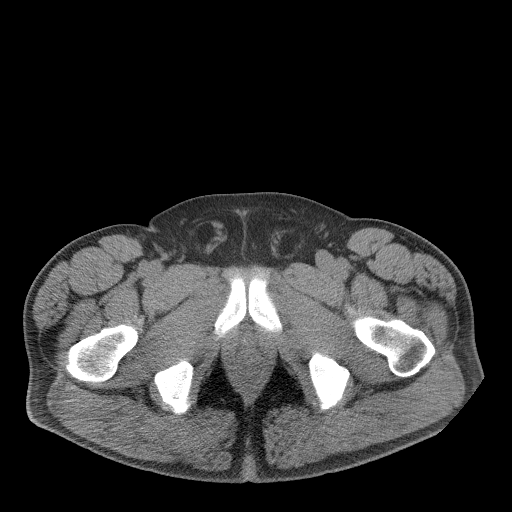
[im 21/100  soft-tissue]
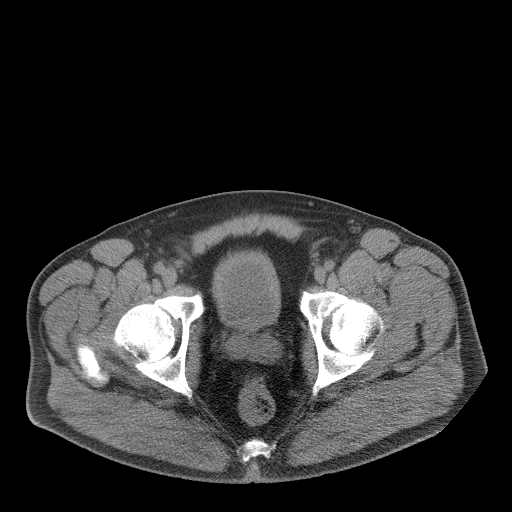
[im 29/100  soft-tissue]
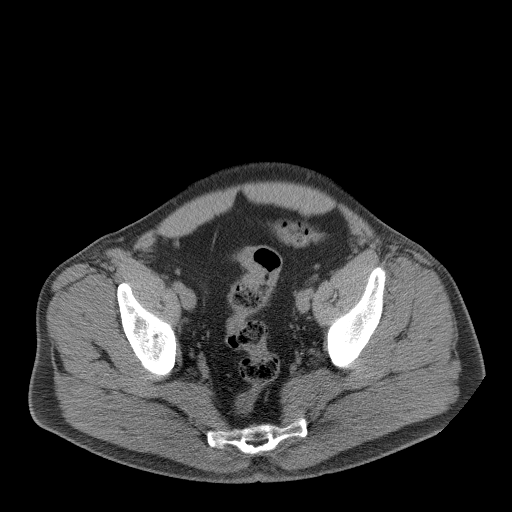
[im 34/100  soft-tissue]
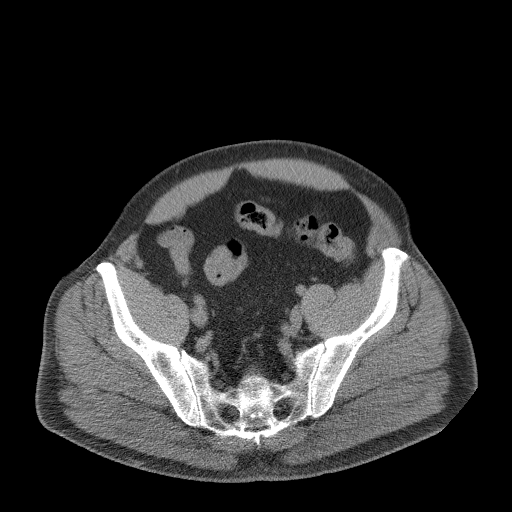
[im 42/100  soft-tissue]
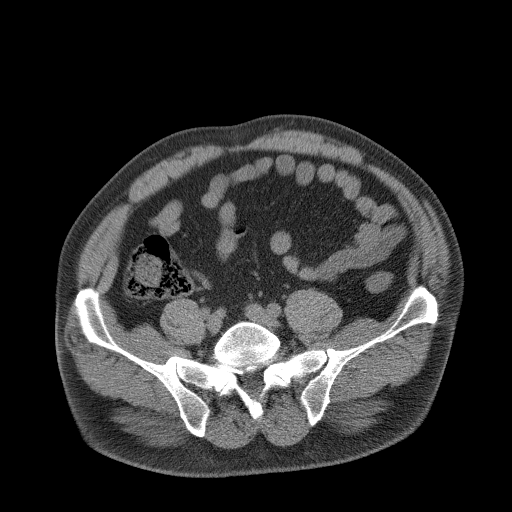
[im 50/100  soft-tissue]
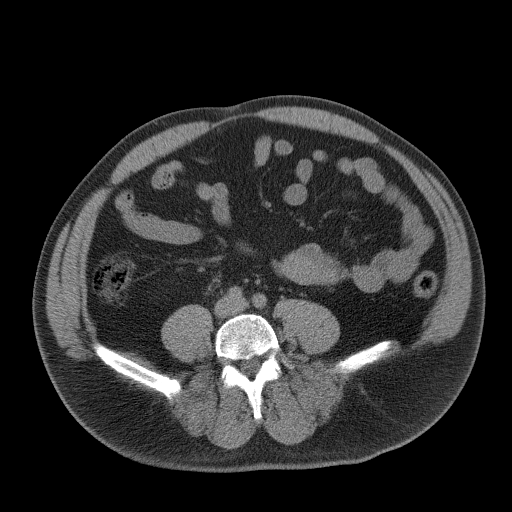
[im 58/100  soft-tissue]
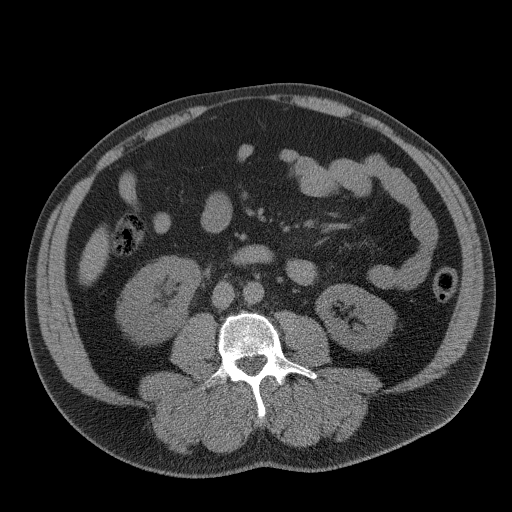
[im 67/100  soft-tissue]
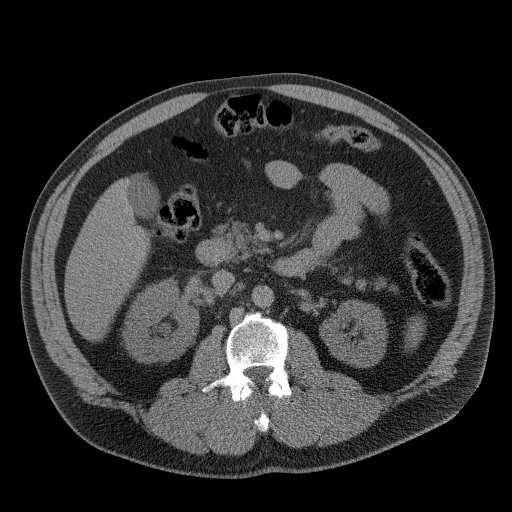
[im 67/100  bone]
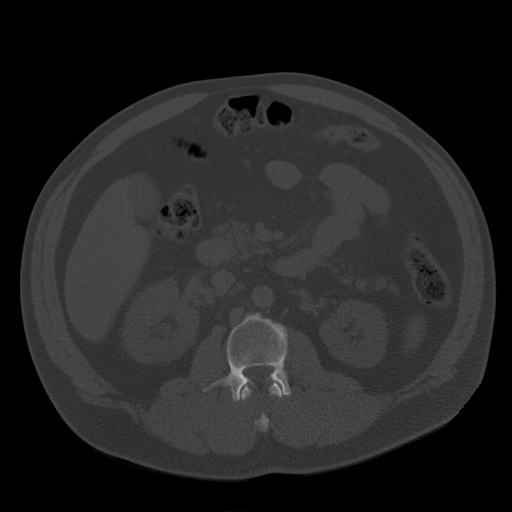
[im 71/100  soft-tissue]
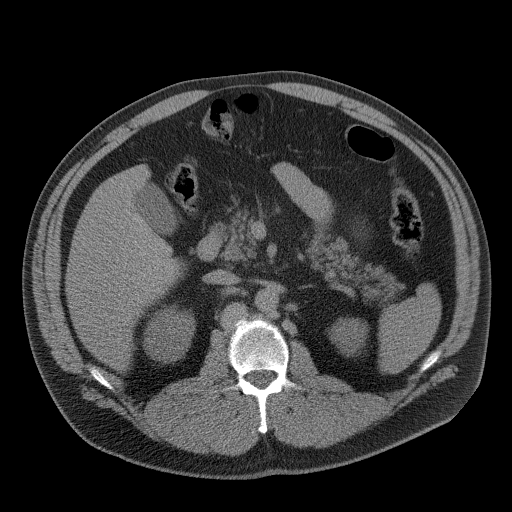
[im 79/100  soft-tissue]
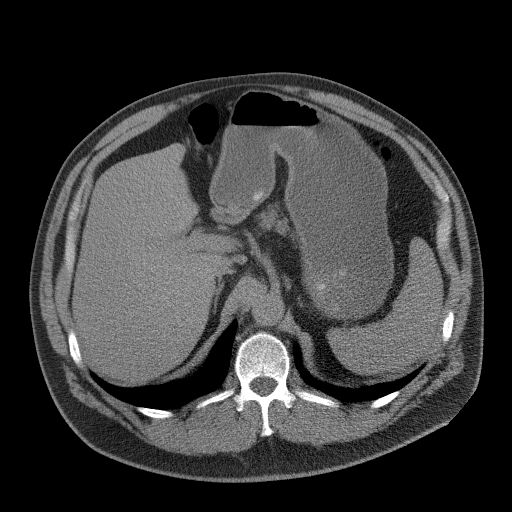
[im 87/100  soft-tissue]
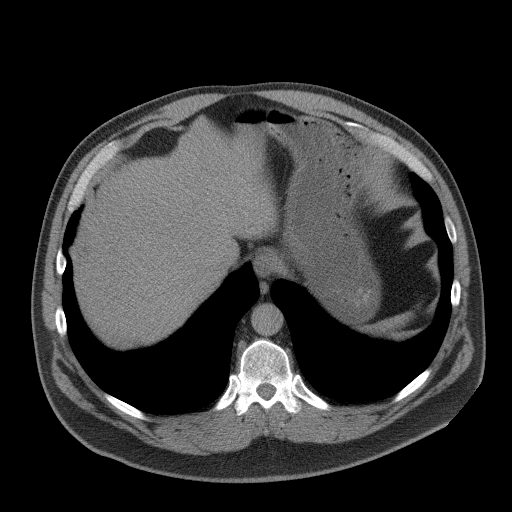
[im 95/100  soft-tissue]
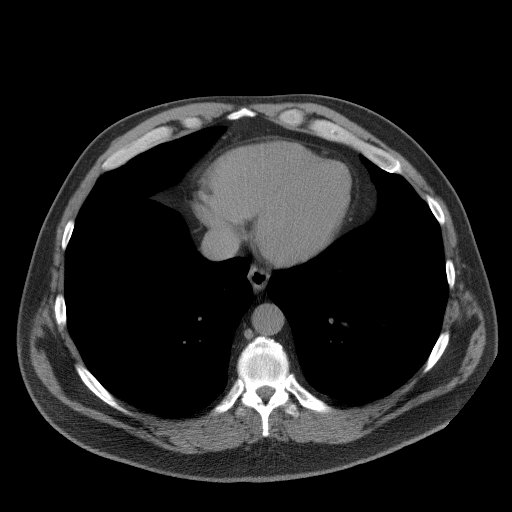

[Series 5: renal stone 3.0 coronal · coronal · 0.92mm/px · 3 of 108 slices shown]
[im 36/108  soft-tissue]
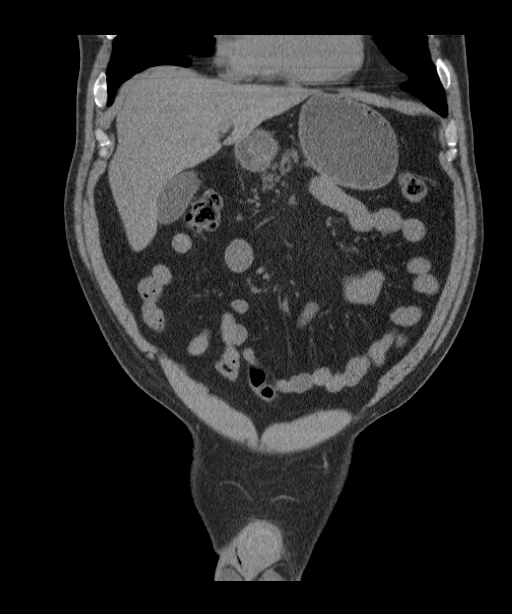
[im 48/108  soft-tissue]
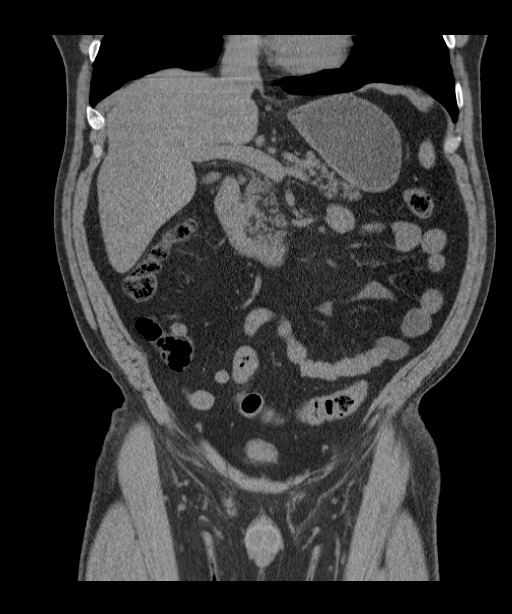
[im 60/108  soft-tissue]
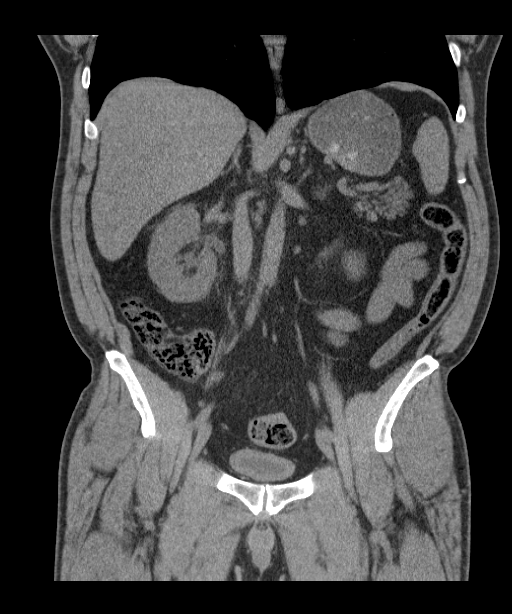

[16 of 46 positions shown; findings below may reference images not displayed]

FINDINGS: Lower Chest: The visualized lower lungs are clear. Cardiac and
mediastinal contours are within normal limits. No pericardial
effusion. Unremarkable visualized distal thoracic esophagus.

Abdomen: Unenhanced CT was performed per clinician order. Lack of IV
contrast limits sensitivity and specificity, especially for
evaluation of abdominal/pelvic solid viscera. Within these
limitations, unremarkable CT appearance of the stomach, duodenum,
spleen, adrenal glands, pancreas and liver. Gallbladder is
unremarkable. No intra or extrahepatic biliary ductal dilatation.

Mild-moderate right hydronephrosis, renal edema and perinephric
stranding. There is a small 3 x 3.7 mm stone in the proximal right
ureter. No additional right-sided nephrolithiasis identified. There
may be a punctate stone in the lower pole collecting system of the
left kidney.

No evidence of obstruction or focal bowel wall thickening. Normal
appendix in the right lower quadrant. The terminal ileum is
unremarkable.

Pelvis: Unremarkable bladder, prostate gland and seminal vesicles.
No free fluid or suspicious adenopathy.

Bones/Soft Tissues: No acute fracture or aggressive appearing lytic
or blastic osseous lesion. Mild L5-S1 degenerative disc disease.
Small bilateral fat containing inguinal hernias versus prominent
spermatic cord fat.

Vascular: Limited evaluation in the absence of IV contrast. No
significant atherosclerotic vascular disease, aneurysmal dilatation
or acute abnormality.
IMPRESSION: 1. Obstructing 3 x 3.7 mm stone in the proximal right ureter
resulting in a mild-moderate right hydronephrosis, renal edema and
perinephric stranding.
2. Additional punctate nonobstructing stone in the lower pole
collecting system of the left kidney.
3. Otherwise, unremarkable CT scan of the abdomen and pelvis.

## 2021-02-24 ENCOUNTER — Emergency Department (INDEPENDENT_AMBULATORY_CARE_PROVIDER_SITE_OTHER): Payer: BC Managed Care – PPO

## 2021-02-24 ENCOUNTER — Other Ambulatory Visit: Payer: Self-pay

## 2021-02-24 ENCOUNTER — Emergency Department (INDEPENDENT_AMBULATORY_CARE_PROVIDER_SITE_OTHER)
Admission: EM | Admit: 2021-02-24 | Discharge: 2021-02-24 | Disposition: A | Discharge status: Home / Self Care | Payer: BC Managed Care – PPO | Source: Home / Self Care

## 2021-02-24 DIAGNOSIS — R059 Cough, unspecified: Secondary | ICD-10-CM

## 2021-02-24 HISTORY — DX: Gastro-esophageal reflux disease without esophagitis: K21.9

## 2021-02-24 MED ORDER — PREDNISONE 20 MG PO TABS: ORAL_TABLET | ORAL | 0 refills | Status: AC

## 2021-02-24 MED ORDER — CEFDINIR 300 MG PO CAPS: 300.0000 mg | ORAL_CAPSULE | Freq: Two times a day (BID) | ORAL | 0 refills | Status: AC

## 2021-02-24 MED ORDER — PROMETHAZINE-DM 6.25-15 MG/5ML PO SYRP: 5.0000 mL | ORAL_SOLUTION | Freq: Two times a day (BID) | ORAL | 0 refills | Status: AC | PRN

## 2021-02-24 NOTE — ED Provider Notes (Signed)
[facility]    CSN: [id number] Arrival date & time: 02/24/21  1402      History   Chief Complaint Chief Complaint  Patient presents with   Cough    HPI Joshua Ayala is a 55 y.o. male.   HPI 55 year old male presents with cough for 1 week.  Patient reports tested negative for COVID-19 on 02/19/2021.  Past Medical History:  Diagnosis Date   Anxiety    GERD (gastroesophageal reflux disease)    Hyperlipidemia    Hypertension    Prostatitis     There are no problems to display for this patient.   Past Surgical History:  Procedure Laterality Date   NASAL SINUS SURGERY         Home Medications    Prior to Admission medications   Medication Sig Start Date End Date Taking? Authorizing Provider  albuterol (VENTOLIN HFA) 108 (90 Base) MCG/ACT inhaler Inhale into the lungs every 6 (six) hours as needed for wheezing or shortness of breath.   Yes [provider]  aspirin 325 MG tablet Take 325 mg by mouth daily.   Yes [provider]  cefdinir (OMNICEF) 300 MG capsule Take 1 capsule (300 mg total) by mouth 2 (two) times daily for 10 days. 02/24/21 03/06/21 Yes Trevor Iha, FNP  cetirizine (ZYRTEC) 10 MG tablet Take 10 mg by mouth daily.   Yes [provider]  fluticasone (FLONASE) 50 MCG/ACT nasal spray Administer 2 sprays in each nostril daily as needed. 01/17/20  Yes [provider]  modafinil (PROVIGIL) 200 MG tablet Take by mouth. 08/12/19  Yes [provider]  predniSONE (DELTASONE) 20 MG tablet Take 3 tabs PO daily x 5 days. 02/24/21  Yes Trevor Iha, FNP  promethazine-dextromethorphan (PROMETHAZINE-DM) 6.25-15 MG/5ML syrup Take 5 mLs by mouth 2 (two) times daily as needed for cough. 02/24/21  Yes Trevor Iha, FNP  HYDROcodone bit-homatropine [facility]) 5-1.5 MG/5ML syrup Take by mouth. 02/19/21 02/24/21  [provider]  HYDROcodone-acetaminophen (NORCO/VICODIN) 5-325 MG per tablet Take 1-2  tablets by mouth every 8 (eight) hours as needed for pain. 12/03/12   Gerhard Munch, MD  HYDROmorphone (DILAUDID) 2 MG tablet Take 1 tablet (2 mg total) by mouth every 4 (four) hours as needed for pain. 12/05/12   Ward, Baxter Hire N, DO  LISINOPRIL-HYDROCHLOROTHIAZIDE PO Take 20 mg by mouth.     [provider]  naproxen sodium (ALEVE) 220 MG tablet Take 2 tablets every 12 hours until stone passes. 09/10/13   Molpus, John, MD  omeprazole (PRILOSEC) 40 MG capsule Take 40 mg by mouth daily.    [provider]  ondansetron (ZOFRAN ODT) 8 MG disintegrating tablet Take 1 tablet (8 mg total) by mouth every 8 (eight) hours as needed for nausea or vomiting. 09/10/13   Molpus, John, MD  ondansetron (ZOFRAN) 4 MG tablet Take 1 tablet (4 mg total) by mouth every 6 (six) hours. 12/05/12   Ward, Layla Maw, DO  oxyCODONE-acetaminophen (PERCOCET) 10-325 MG per tablet Take 0.5-1 tablets by mouth every 4 (four) hours as needed for pain. 09/10/13   Molpus, John, MD  pantoprazole (PROTONIX) 40 MG tablet Take 40 mg by mouth daily.    [provider]  PARoxetine (PAXIL) 20 MG tablet Take 20 mg by mouth every morning.    [provider]  simvastatin (ZOCOR) 20 MG tablet Take 20 mg by mouth every evening.    [provider]  tamsulosin (FLOMAX) 0.4 MG CAPS capsule Take 1 capsule  daily until stone passes. 09/10/13   Molpus, John, MD  TAMSULOSIN HCL PO Take by mouth.    [provider]    Family History Family History  Problem Relation Age of Onset   Hypertension Mother    Cancer Mother    Thyroid disease Mother    Thyroid disease Father    Hypertension Father    Heart disease Father     Social History Social History   Tobacco Use   Smoking status: Former    Types: Cigarettes    Quit date: 09/14/2011    Years since quitting: 9.4  Vaping Use   Vaping Use: Never used  Substance Use Topics   Alcohol use: Yes    Comment: Ocassionally   Drug use: No      Allergies   Codeine, Diclofenac, Doxycycline, Penicillins, Sulfa antibiotics, and Tetracyclines & related   Review of Systems Review of Systems  Respiratory:  Positive for cough.   All other systems reviewed and are negative.   Physical Exam Triage Vital Signs ED Triage Vitals  Enc Vitals Group     BP 02/24/21 1506 133/84     Pulse Rate 02/24/21 1506 85     Resp 02/24/21 1506 (!) 24     Temp 02/24/21 1506 97.6 F (36.4 C)     Temp Source 02/24/21 1506 Oral     SpO2 02/24/21 1506 94 %     Weight 02/24/21 1454 285 lb (129.3 kg)     Height 02/24/21 1454 5\' 11"  (1.803 m)     Head Circumference --      Peak Flow --      Pain Score 02/24/21 1453 0     Pain Loc --      Pain Edu? --      Excl. in GC? --    No data found.  Updated Vital Signs BP 133/84 (BP Location: Right Arm)   Pulse 85   Temp 97.6 F (36.4 C) (Oral)   Resp (!) 24   Ht 5\' 11"  (1.803 m)   Wt 285 lb (129.3 kg)   SpO2 94%   BMI 39.75 kg/m    Physical Exam Vitals and nursing note reviewed.  Constitutional:      General: He is not in acute distress.    Appearance: Normal appearance. He is obese. He is ill-appearing.  HENT:     Head: Normocephalic and atraumatic.     Right Ear: Tympanic membrane, ear canal and external ear normal.     Left Ear: Tympanic membrane, ear canal and external ear normal.     Mouth/Throat:     Mouth: Mucous membranes are moist.     Pharynx: Oropharynx is clear.  Eyes:     Extraocular Movements: Extraocular movements intact.     Conjunctiva/sclera: Conjunctivae normal.     Pupils: Pupils are equal, round, and reactive to light.  Cardiovascular:     Rate and Rhythm: Normal rate and regular rhythm.     Pulses: Normal pulses.     Heart sounds: Normal heart sounds.  Pulmonary:     Effort: Pulmonary effort is normal.     Breath sounds: Normal breath sounds. No stridor. No wheezing, rhonchi or rales.  Musculoskeletal:        General: Normal range of motion.      Cervical back: Normal range of motion and neck supple.  Skin:    General: Skin is warm and dry.  Neurological:     General: No focal  deficit present.     Mental Status: He is alert and oriented to person, place, and time.     UC Treatments / Results  Labs (all labs ordered are listed, but only abnormal results are displayed) Labs Reviewed - No data to display  EKG   Radiology DG Chest 2 View  Result Date: 02/24/2021 CLINICAL DATA:  Cough.  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 EXAM: CHEST - 2 VIEW COMPARISON:  March 22, 2013 FINDINGS: Reticular opacity in the left upper lung not present in 2014. No other focal infiltrate. Suggested nodule in the left upper lobe just inferior to the first costochondral junction measuring 7 mm, not definitely seen in 2014. The cardiomediastinal silhouette is unremarkable. IMPRESSION: 1. The opacity in the left upper lung is favored to represent scarring given history. Acute infiltrate not excluded but considered less likely. 2. 7 mm nodule in the left upper lobe. Recommend CT imaging for better evaluation. This could be done as an outpatient. Electronically Signed   By: Gerome Sam III M.D.   On: 02/24/2021 15:44    Procedures Procedures (including critical care time)  Medications Ordered in UC Medications - No data to display  Initial Impression / Assessment and Plan / UC Course  I have reviewed the triage vital signs and the nursing notes.  Pertinent labs & imaging results that were available during my care of the patient were reviewed by me and considered in my medical decision making (see chart for details).     MDM: 1.  Cough-Rx'd cefdinir, prednisone burst, and Promethazine DM.  Advised patient of CXR results above, advised patient to follow-up with PCP within the next 2 weeks to have CT of chest perform for further evaluation of 7 mm nodule in left upper lobe.  Advised patient to take medication as directed with food to completion.  Advised  patient to use Promethazine DM in the evening or at night prior to sleep only due to sedate of effects.  Encouraged patient to increase daily water intake while taking these medications. Final Clinical Impressions(s) / UC Diagnoses   Final diagnoses:  Cough, unspecified type     Discharge Instructions      Advised patient of CXR results above, advised patient to follow-up with PCP within the next 2 weeks to have CT of chest perform for further evaluation of 7 mm nodule in left upper lobe.  Advised patient to take medication as directed with food to completion.  Advised patient to use Promethazine DM in the evening or at night prior to sleep only due to sedate of effects.  Encouraged patient increase daily water intake while taking these medications.     ED Prescriptions     Medication Sig Dispense Auth. Provider   cefdinir (OMNICEF) 300 MG capsule Take 1 capsule (300 mg total) by mouth 2 (two) times daily for 10 days. 20 capsule Trevor Iha, FNP   predniSONE (DELTASONE) 20 MG tablet Take 3 tabs PO daily x 5 days. 15 tablet Trevor Iha, FNP   promethazine-dextromethorphan (PROMETHAZINE-DM) 6.25-15 MG/5ML syrup Take 5 mLs by mouth 2 (two) times daily as needed for cough. 118 mL Trevor Iha, FNP      PDMP not reviewed this encounter.   Trevor Iha, FNP 02/24/21 1711

## 2021-02-24 NOTE — Discharge Instructions (Addendum)
Advised patient of CXR results above, advised patient to follow-up with PCP within the next 2 weeks to have CT of chest perform for further evaluation of 7 mm nodule in left upper lobe.  Advised patient to take medication as directed with food to completion.  Advised patient to use Promethazine DM in the evening or at night prior to sleep only due to sedate of effects.  Encouraged patient increase daily water intake while taking these medications.

## 2021-02-24 NOTE — ED Triage Notes (Signed)
Pt presents to Urgent Care with c/o cough for one week. Went to UC on 11/15 and tested negative for COVID. Reports cough syrup has not helped and cough has become productive.

## 2023-05-01 IMAGING — DX DG CHEST 2V
2 series · 2 of 2 positions shown · non-contrast
Comparison: March 22, 2013

CLINICAL DATA: Cough.  Pulmonary fibrosis after COVID.

EXAM:
CHEST - 2 VIEW

[chest pa]
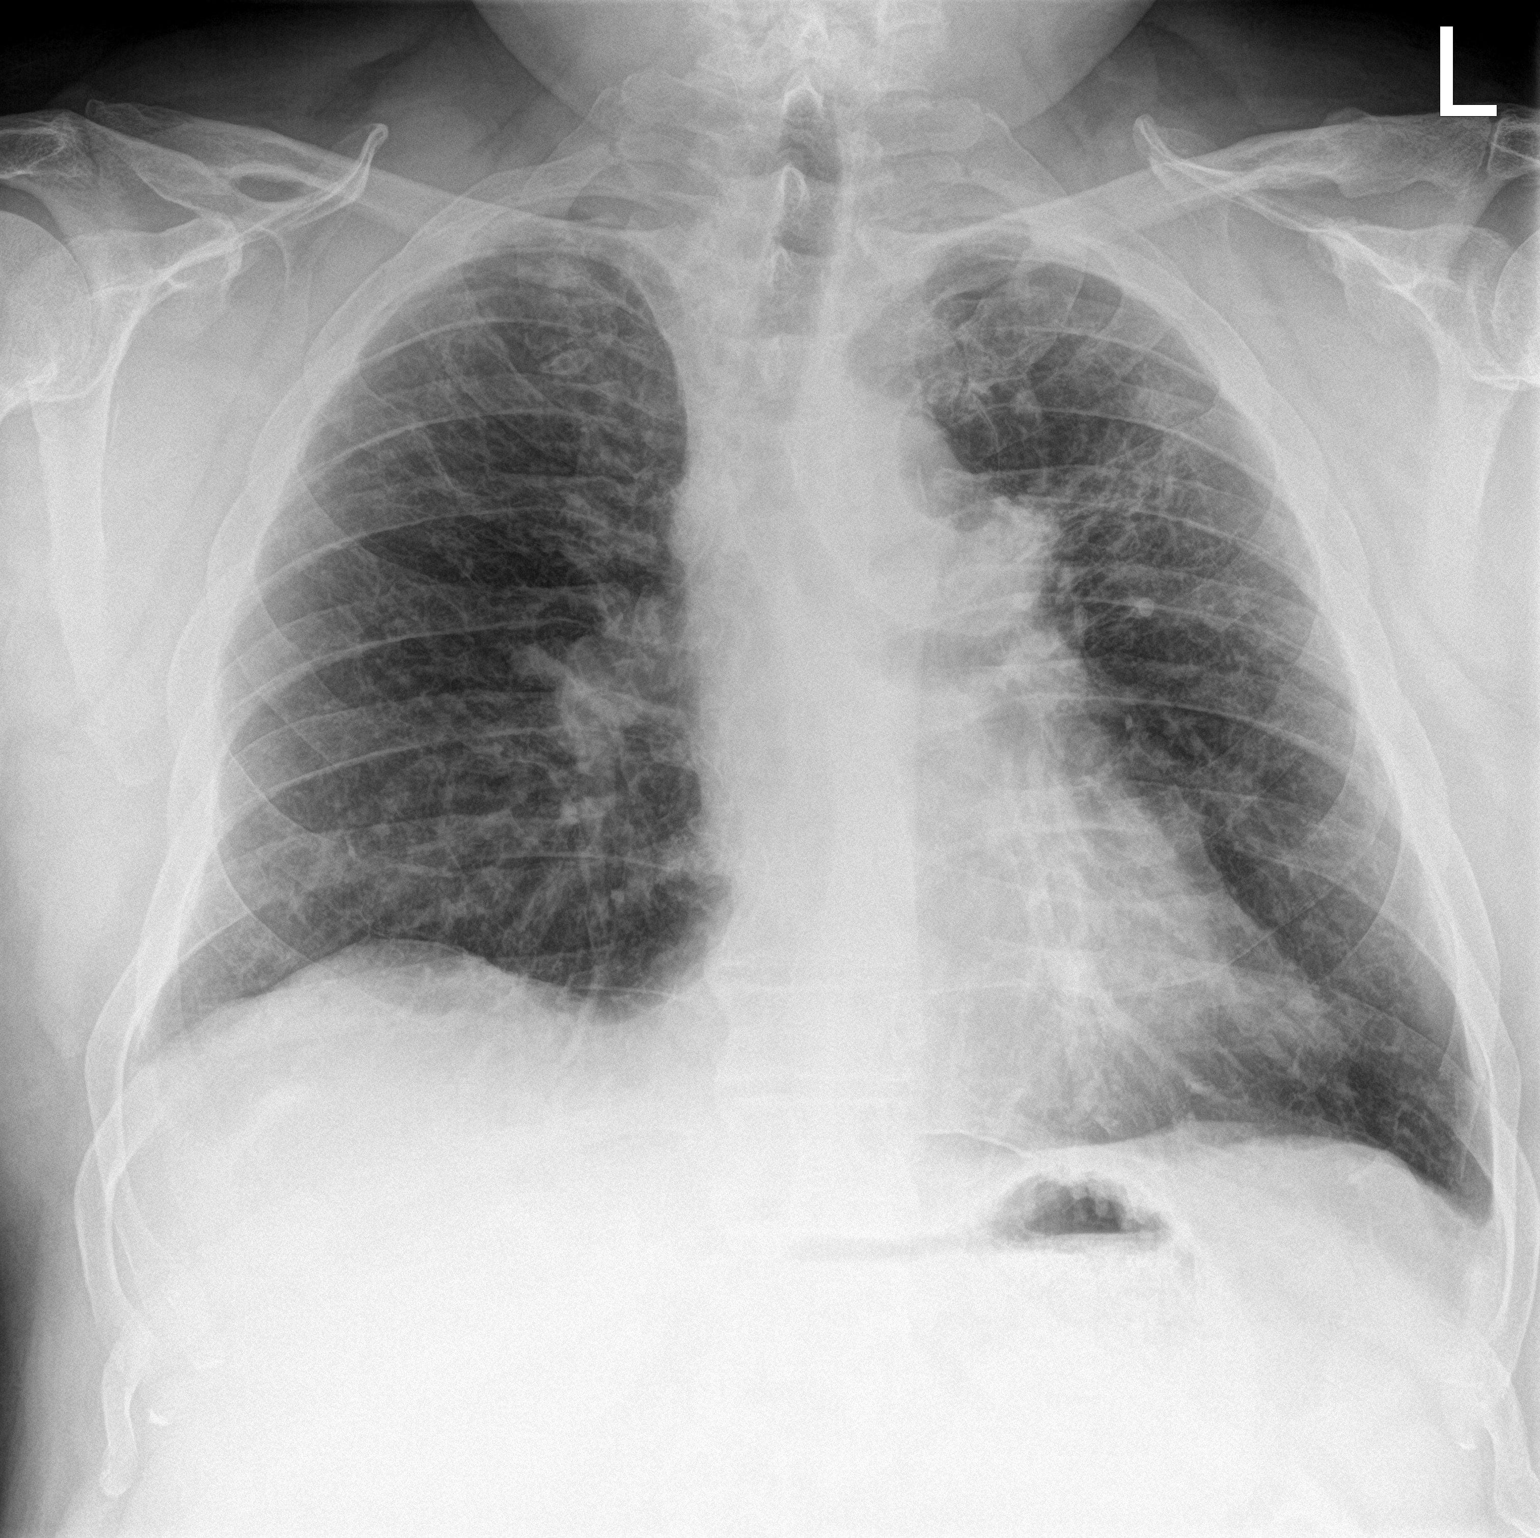

[chest lat]
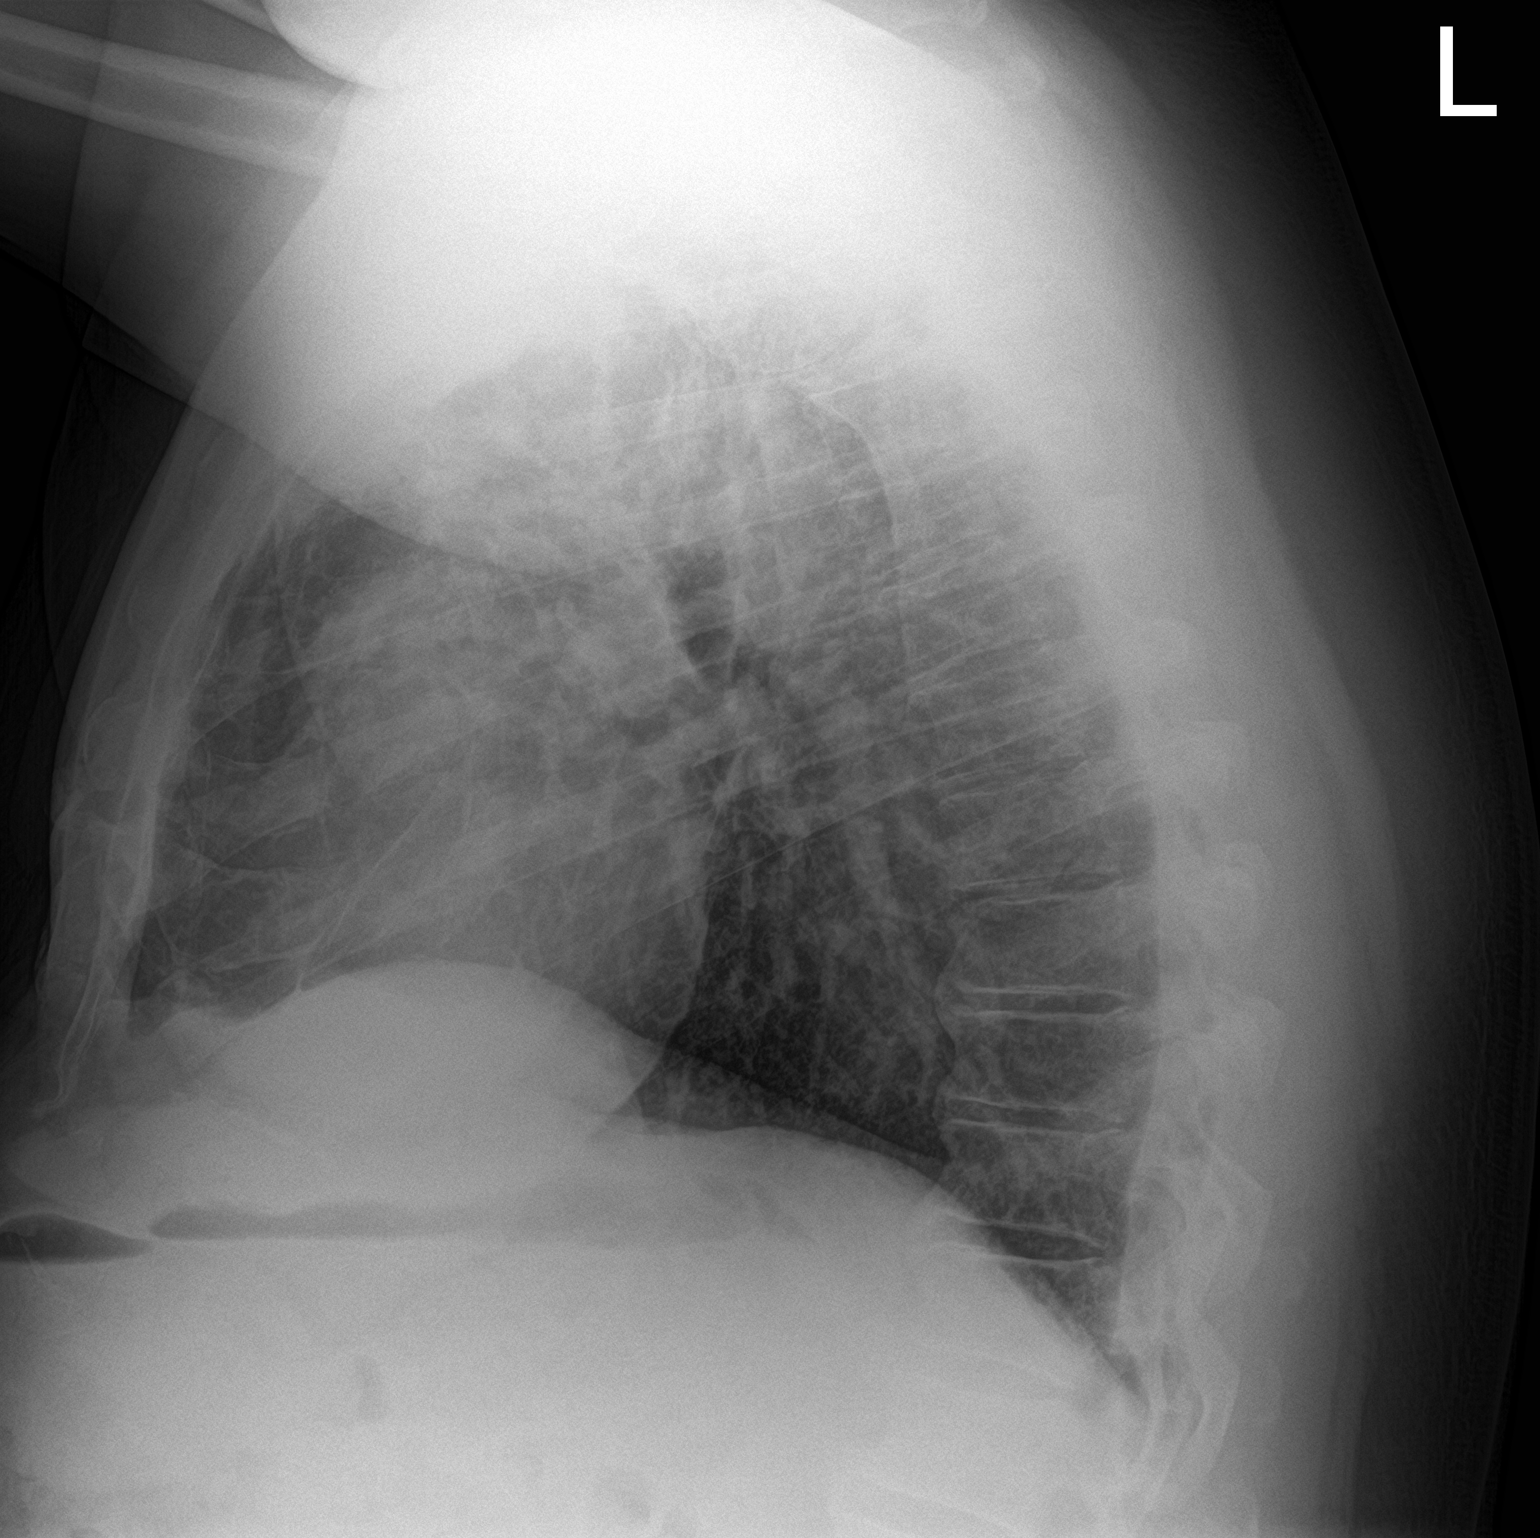

[2 of 2 positions shown; findings below may reference images not displayed]

FINDINGS: Reticular opacity in the left upper lung not present in 2048. No
other focal infiltrate. Suggested nodule in the left upper lobe just
inferior to the first costochondral junction measuring 7 mm, not
definitely seen in 2048. The cardiomediastinal silhouette is
unremarkable.
IMPRESSION: 1. The opacity in the left upper lung is favored to represent
scarring given history. Acute infiltrate not excluded but considered
less likely.
2. 7 mm nodule in the left upper lobe. Recommend CT imaging for
better evaluation. This could be done as an outpatient.
